# Patient Record
Sex: Male | Born: 1960 | Race: White | Hispanic: No | Marital: Single
Health system: Southern US, Community
[De-identification: ages and names within clinical notes are randomized; demographics above are authoritative.]

---

## 2003-12-24 ENCOUNTER — Emergency Department (HOSPITAL_COMMUNITY): Admission: AC | Admit: 2003-12-24 | Discharge: 2003-12-24 | Payer: Self-pay

## 2006-03-26 ENCOUNTER — Emergency Department (HOSPITAL_COMMUNITY): Admission: EM | Admit: 2006-03-26 | Discharge: 2006-03-26 | Payer: Self-pay | Admitting: Emergency Medicine

## 2010-03-07 ENCOUNTER — Inpatient Hospital Stay (HOSPITAL_COMMUNITY): Admission: EM | Admit: 2010-03-07 | Discharge: 2010-03-18 | Payer: Self-pay | Admitting: Emergency Medicine

## 2010-03-09 ENCOUNTER — Encounter (INDEPENDENT_AMBULATORY_CARE_PROVIDER_SITE_OTHER): Payer: Self-pay | Admitting: Internal Medicine

## 2010-03-09 ENCOUNTER — Ambulatory Visit: Payer: Self-pay | Admitting: Surgery

## 2010-03-09 ENCOUNTER — Ambulatory Visit: Payer: Self-pay | Admitting: Internal Medicine

## 2010-03-10 ENCOUNTER — Encounter: Admission: AD | Admit: 2010-03-10 | Discharge: 2010-03-16 | Payer: Self-pay | Admitting: Internal Medicine

## 2010-03-19 ENCOUNTER — Ambulatory Visit: Payer: Self-pay | Admitting: Psychiatry

## 2010-03-23 ENCOUNTER — Telehealth (INDEPENDENT_AMBULATORY_CARE_PROVIDER_SITE_OTHER): Payer: Self-pay | Admitting: *Deleted

## 2010-06-01 ENCOUNTER — Ambulatory Visit: Payer: Self-pay | Admitting: Surgery

## 2010-06-01 ENCOUNTER — Ambulatory Visit: Admission: RE | Admit: 2010-06-01 | Discharge: 2010-06-01 | Payer: Self-pay | Admitting: Family Medicine

## 2010-12-28 NOTE — Progress Notes (Signed)
Summary: appt/discharge instr given  Phone Note Call from Patient Call back at Home Phone [phone] Call back at [phone]   Caller: Burna Mortimer Call For: wert Reason for Call: Talk to Nurse Summary of Call: wants to know if he should refill his meds he was given , especially his med for blood clots?  I schedulled him for a post hsop/cons with [facility].  Didn't see any discharge papers in computer.  Pt was taking shots for PE, finishe dthem Sat.  Hasn't had anything for PE since Sat.  Please respond asap. Initial call taken by: Eugene Gavia,  March 23, 2010 8:37 AM  Follow-up for Phone Call        Spoke with pt.  He states that he has finished lovenox but has not started the coumadin yet.  Per d/c instructions he is to take coumadin 5 mg daily and be followed by [facility] within the next wk to check PT/INR.  I advised pt of this and he verbalized understanding.  He is going to get rx filled and call today to set up appt with [facility] for early next wk.  He will keep planned hosp f/u here with [facility] for 5/10 and bring all meds to visit. Follow-up by: Vernie Murders,  March 23, 2010 9:08 AM

## 2011-02-13 LAB — VANCOMYCIN, RANDOM: Vancomycin Rm: 11.8 ug/mL

## 2011-02-13 LAB — BLOOD GAS, ARTERIAL
Acid-Base Excess: 9.8 mmol/L — ABNORMAL HIGH (ref 0.0–2.0)
Bicarbonate: 32.4 mEq/L — ABNORMAL HIGH (ref 20.0–24.0)
Bicarbonate: 34.3 mEq/L — ABNORMAL HIGH (ref 20.0–24.0)
Drawn by: 232811
Drawn by: 232811
Drawn by: 308601
FIO2: 0.4 %
MECHVT: 390 mL
O2 Saturation: 95.8 %
O2 Saturation: 97.5 %
O2 Saturation: 97.6 %
PEEP: 5 cmH2O
PEEP: 5 cmH2O
PEEP: 5 cmH2O
PEEP: 5 cmH2O
Patient temperature: 98.6
RATE: 30 resp/min
RATE: 30 resp/min
RATE: 30 resp/min
TCO2: 25.3 mmol/L (ref 0–100)
TCO2: 30.6 mmol/L (ref 0–100)
TCO2: 31.1 mmol/L (ref 0–100)
pCO2 arterial: 42.8 mmHg (ref 35.0–45.0)
pCO2 arterial: 50.1 mmHg — ABNORMAL HIGH (ref 35.0–45.0)
pCO2 arterial: 52.2 mmHg — ABNORMAL HIGH (ref 35.0–45.0)
pH, Arterial: 7.457 — ABNORMAL HIGH (ref 7.350–7.450)
pH, Arterial: 7.492 — ABNORMAL HIGH (ref 7.350–7.450)
pO2, Arterial: 100 mmHg (ref 80.0–100.0)
pO2, Arterial: 76.9 mmHg — ABNORMAL LOW (ref 80.0–100.0)
pO2, Arterial: 83.5 mmHg (ref 80.0–100.0)
pO2, Arterial: 95 mmHg (ref 80.0–100.0)

## 2011-02-13 LAB — GLUCOSE, CAPILLARY
Glucose-Capillary: 102 mg/dL — ABNORMAL HIGH (ref 70–99)
Glucose-Capillary: 102 mg/dL — ABNORMAL HIGH (ref 70–99)
Glucose-Capillary: 103 mg/dL — ABNORMAL HIGH (ref 70–99)
Glucose-Capillary: 103 mg/dL — ABNORMAL HIGH (ref 70–99)
Glucose-Capillary: 103 mg/dL — ABNORMAL HIGH (ref 70–99)
Glucose-Capillary: 105 mg/dL — ABNORMAL HIGH (ref 70–99)
Glucose-Capillary: 106 mg/dL — ABNORMAL HIGH (ref 70–99)
Glucose-Capillary: 108 mg/dL — ABNORMAL HIGH (ref 70–99)
Glucose-Capillary: 111 mg/dL — ABNORMAL HIGH (ref 70–99)
Glucose-Capillary: 112 mg/dL — ABNORMAL HIGH (ref 70–99)
Glucose-Capillary: 114 mg/dL — ABNORMAL HIGH (ref 70–99)
Glucose-Capillary: 115 mg/dL — ABNORMAL HIGH (ref 70–99)
Glucose-Capillary: 118 mg/dL — ABNORMAL HIGH (ref 70–99)
Glucose-Capillary: 120 mg/dL — ABNORMAL HIGH (ref 70–99)
Glucose-Capillary: 121 mg/dL — ABNORMAL HIGH (ref 70–99)
Glucose-Capillary: 123 mg/dL — ABNORMAL HIGH (ref 70–99)
Glucose-Capillary: 128 mg/dL — ABNORMAL HIGH (ref 70–99)
Glucose-Capillary: 130 mg/dL — ABNORMAL HIGH (ref 70–99)
Glucose-Capillary: 141 mg/dL — ABNORMAL HIGH (ref 70–99)

## 2011-02-13 LAB — URINE CULTURE: Culture: NO GROWTH

## 2011-02-13 LAB — HEPATIC FUNCTION PANEL
ALT: 14 U/L (ref 0–53)
AST: 44 U/L — ABNORMAL HIGH (ref 0–37)
Albumin: 2.7 g/dL — ABNORMAL LOW (ref 3.5–5.2)
Indirect Bilirubin: 0.9 mg/dL (ref 0.3–0.9)
Total Bilirubin: 1.3 mg/dL — ABNORMAL HIGH (ref 0.3–1.2)
Total Protein: 6.2 g/dL (ref 6.0–8.3)
Total Protein: 6.3 g/dL (ref 6.0–8.3)

## 2011-02-13 LAB — URINALYSIS, ROUTINE W REFLEX MICROSCOPIC
Bilirubin Urine: NEGATIVE
Glucose, UA: NEGATIVE mg/dL
Hgb urine dipstick: NEGATIVE
Specific Gravity, Urine: 1.014 (ref 1.005–1.030)
Urobilinogen, UA: 0.2 mg/dL (ref 0.0–1.0)
pH: 5 (ref 5.0–8.0)

## 2011-02-13 LAB — BASIC METABOLIC PANEL
BUN: 10 mg/dL (ref 6–23)
BUN: 4 mg/dL — ABNORMAL LOW (ref 6–23)
BUN: 7 mg/dL (ref 6–23)
CO2: 36 mEq/L — ABNORMAL HIGH (ref 19–32)
Calcium: 8.5 mg/dL (ref 8.4–10.5)
Calcium: 8.7 mg/dL (ref 8.4–10.5)
Calcium: 9.3 mg/dL (ref 8.4–10.5)
Chloride: 101 mEq/L (ref 96–112)
Chloride: 95 mEq/L — ABNORMAL LOW (ref 96–112)
Chloride: 95 mEq/L — ABNORMAL LOW (ref 96–112)
Chloride: 97 mEq/L (ref 96–112)
Chloride: 99 mEq/L (ref 96–112)
Creatinine, Ser: 0.66 mg/dL (ref 0.4–1.5)
Creatinine, Ser: 0.67 mg/dL (ref 0.4–1.5)
Creatinine, Ser: 0.87 mg/dL (ref 0.4–1.5)
Creatinine, Ser: 1.24 mg/dL (ref 0.4–1.5)
Creatinine, Ser: 1.46 mg/dL (ref 0.4–1.5)
GFR calc Af Amer: 60 mL/min — ABNORMAL LOW (ref >60)
GFR calc Af Amer: >60 mL/min (ref >60)
GFR calc Af Amer: >60 mL/min (ref >60)
GFR calc Af Amer: >60 mL/min (ref >60)
GFR calc non Af Amer: 51 mL/min — ABNORMAL LOW (ref >60)
GFR calc non Af Amer: >60 mL/min (ref >60)
GFR calc non Af Amer: >60 mL/min (ref >60)
GFR calc non Af Amer: >60 mL/min (ref >60)
GFR calc non Af Amer: >60 mL/min (ref >60)
Glucose, Bld: 106 mg/dL — ABNORMAL HIGH (ref 70–99)
Glucose, Bld: 129 mg/dL — ABNORMAL HIGH (ref 70–99)
Potassium: 3.7 mEq/L (ref 3.5–5.1)
Potassium: 4.4 mEq/L (ref 3.5–5.1)
Sodium: 139 mEq/L (ref 135–145)
Sodium: 141 mEq/L (ref 135–145)
Sodium: 142 mEq/L (ref 135–145)
Sodium: 142 mEq/L (ref 135–145)

## 2011-02-13 LAB — CBC
HCT: 29.7 % — ABNORMAL LOW (ref 39.0–52.0)
HCT: 32.1 % — ABNORMAL LOW (ref 39.0–52.0)
HCT: 33.4 % — ABNORMAL LOW (ref 39.0–52.0)
HCT: 33.4 % — ABNORMAL LOW (ref 39.0–52.0)
Hemoglobin: 10.8 g/dL — ABNORMAL LOW (ref 13.0–17.0)
Hemoglobin: 11.3 g/dL — ABNORMAL LOW (ref 13.0–17.0)
Hemoglobin: 11.6 g/dL — ABNORMAL LOW (ref 13.0–17.0)
Hemoglobin: 12.4 g/dL — ABNORMAL LOW (ref 13.0–17.0)
Hemoglobin: 9.9 g/dL — ABNORMAL LOW (ref 13.0–17.0)
MCHC: 33.6 g/dL (ref 30.0–36.0)
MCHC: 33.7 g/dL (ref 30.0–36.0)
MCV: 115.4 fL — ABNORMAL HIGH (ref 78.0–100.0)
MCV: 115.5 fL — ABNORMAL HIGH (ref 78.0–100.0)
MCV: 117.5 fL — ABNORMAL HIGH (ref 78.0–100.0)
MCV: 117.7 fL — ABNORMAL HIGH (ref 78.0–100.0)
Platelets: 234 10*3/uL (ref 150–400)
Platelets: 299 10*3/uL (ref 150–400)
Platelets: 432 10*3/uL — ABNORMAL HIGH (ref 150–400)
Platelets: 459 10*3/uL — ABNORMAL HIGH (ref 150–400)
Platelets: 467 10*3/uL — ABNORMAL HIGH (ref 150–400)
RBC: 2.87 MIL/uL — ABNORMAL LOW (ref 4.22–5.81)
RBC: 2.89 MIL/uL — ABNORMAL LOW (ref 4.22–5.81)
RBC: 2.96 MIL/uL — ABNORMAL LOW (ref 4.22–5.81)
RBC: 3.02 MIL/uL — ABNORMAL LOW (ref 4.22–5.81)
RDW: 17.4 % — ABNORMAL HIGH (ref 11.5–15.5)
RDW: 17.8 % — ABNORMAL HIGH (ref 11.5–15.5)
RDW: 18.3 % — ABNORMAL HIGH (ref 11.5–15.5)
RDW: 18.6 % — ABNORMAL HIGH (ref 11.5–15.5)
RDW: 18.9 % — ABNORMAL HIGH (ref 11.5–15.5)
WBC: 10.6 10*3/uL — ABNORMAL HIGH (ref 4.0–10.5)
WBC: 11.4 10*3/uL — ABNORMAL HIGH (ref 4.0–10.5)
WBC: 13.8 10*3/uL — ABNORMAL HIGH (ref 4.0–10.5)
WBC: 14.3 10*3/uL — ABNORMAL HIGH (ref 4.0–10.5)
WBC: 14.8 10*3/uL — ABNORMAL HIGH (ref 4.0–10.5)
WBC: 8.9 10*3/uL (ref 4.0–10.5)

## 2011-02-13 LAB — COMPREHENSIVE METABOLIC PANEL
ALT: 40 U/L (ref 0–53)
AST: 79 U/L — ABNORMAL HIGH (ref 0–37)
Albumin: 2.9 g/dL — ABNORMAL LOW (ref 3.5–5.2)
Alkaline Phosphatase: 106 U/L (ref 39–117)
BUN: 14 mg/dL (ref 6–23)
Chloride: 94 mEq/L — ABNORMAL LOW (ref 96–112)
Potassium: 3.7 mEq/L (ref 3.5–5.1)
Total Bilirubin: 1.3 mg/dL — ABNORMAL HIGH (ref 0.3–1.2)

## 2011-02-13 LAB — DIFFERENTIAL
Basophils Relative: 1 % (ref 0–1)
Eosinophils Absolute: 0.1 10*3/uL (ref 0.0–0.7)
Eosinophils Relative: 1 % (ref 0–5)
Lymphocytes Relative: 13 % (ref 12–46)
Monocytes Absolute: 1.1 10*3/uL — ABNORMAL HIGH (ref 0.1–1.0)
Neutrophils Relative %: 73 % (ref 43–77)

## 2011-02-13 LAB — BRAIN NATRIURETIC PEPTIDE: Pro B Natriuretic peptide (BNP): 345 pg/mL — ABNORMAL HIGH (ref 0.0–100.0)

## 2011-02-13 LAB — CARDIAC PANEL(CRET KIN+CKTOT+MB+TROPI)
Relative Index: 1.7 (ref 0.0–2.5)
Relative Index: INVALID (ref 0.0–2.5)
Total CK: 198 U/L (ref 7–232)
Troponin I: 0.14 ng/mL — ABNORMAL HIGH (ref 0.00–0.06)

## 2011-02-13 LAB — PHOSPHORUS: Phosphorus: 4.5 mg/dL (ref 2.3–4.6)

## 2011-02-13 LAB — ALT
ALT: 49 U/L (ref 0–53)
ALT: 78 U/L — ABNORMAL HIGH (ref 0–53)

## 2011-02-13 LAB — MAGNESIUM
Magnesium: 1.6 mg/dL (ref 1.5–2.5)
Magnesium: 1.7 mg/dL (ref 1.5–2.5)

## 2011-02-13 LAB — PROTIME-INR
INR: 1.21 (ref 0.00–1.49)
INR: 1.33 (ref 0.00–1.49)
Prothrombin Time: 14.8 seconds (ref 11.6–15.2)
Prothrombin Time: 16.4 seconds — ABNORMAL HIGH (ref 11.6–15.2)

## 2011-02-13 LAB — C-REACTIVE PROTEIN
CRP: 2.7 mg/dL — ABNORMAL HIGH (ref <0.6)
CRP: 9.1 mg/dL — ABNORMAL HIGH (ref <0.6)

## 2011-02-13 LAB — VANCOMYCIN, TROUGH
Vancomycin Tr: 36.8 ug/mL (ref 10.0–20.0)
Vancomycin Tr: 38.6 ug/mL (ref 10.0–20.0)

## 2011-02-13 LAB — POTASSIUM: Potassium: 3 mEq/L — ABNORMAL LOW (ref 3.5–5.1)

## 2011-02-13 LAB — CK
Total CK: 107 U/L (ref 7–232)
Total CK: 70 U/L (ref 7–232)

## 2011-02-13 LAB — CULTURE, BAL-QUANTITATIVE W GRAM STAIN

## 2011-02-13 LAB — PROCALCITONIN: Procalcitonin: 0.67 ng/mL

## 2011-02-13 LAB — APTT: aPTT: 50 seconds — ABNORMAL HIGH (ref 24–37)

## 2011-02-14 LAB — URINALYSIS, ROUTINE W REFLEX MICROSCOPIC
Hgb urine dipstick: NEGATIVE
Protein, ur: 30 mg/dL — AB
Urobilinogen, UA: 1 mg/dL (ref 0.0–1.0)

## 2011-02-14 LAB — MAGNESIUM
Magnesium: 1.4 mg/dL — ABNORMAL LOW (ref 1.5–2.5)
Magnesium: 1.4 mg/dL — ABNORMAL LOW (ref 1.5–2.5)
Magnesium: 1.7 mg/dL (ref 1.5–2.5)

## 2011-02-14 LAB — HEPATIC FUNCTION PANEL
Bilirubin, Direct: 0.6 mg/dL — ABNORMAL HIGH (ref 0.0–0.3)
Indirect Bilirubin: 1.2 mg/dL — ABNORMAL HIGH (ref 0.3–0.9)

## 2011-02-14 LAB — COMPREHENSIVE METABOLIC PANEL
ALT: 19 U/L (ref 0–53)
AST: 51 U/L — ABNORMAL HIGH (ref 0–37)
Albumin: 3.3 g/dL — ABNORMAL LOW (ref 3.5–5.2)
Alkaline Phosphatase: 106 U/L (ref 39–117)
BUN: 3 mg/dL — ABNORMAL LOW (ref 6–23)
BUN: 4 mg/dL — ABNORMAL LOW (ref 6–23)
BUN: 4 mg/dL — ABNORMAL LOW (ref 6–23)
CO2: 25 mEq/L (ref 19–32)
CO2: 28 mEq/L (ref 19–32)
Chloride: 92 mEq/L — ABNORMAL LOW (ref 96–112)
Chloride: 95 mEq/L — ABNORMAL LOW (ref 96–112)
Chloride: 97 mEq/L (ref 96–112)
Creatinine, Ser: 0.59 mg/dL (ref 0.4–1.5)
Creatinine, Ser: 0.65 mg/dL (ref 0.4–1.5)
GFR calc Af Amer: >60 mL/min (ref >60)
GFR calc non Af Amer: >60 mL/min (ref >60)
GFR calc non Af Amer: >60 mL/min (ref >60)
Glucose, Bld: 99 mg/dL (ref 70–99)
Potassium: 2.1 mEq/L — CL (ref 3.5–5.1)
Sodium: 131 mEq/L — ABNORMAL LOW (ref 135–145)
Total Bilirubin: 1.9 mg/dL — ABNORMAL HIGH (ref 0.3–1.2)
Total Bilirubin: 2 mg/dL — ABNORMAL HIGH (ref 0.3–1.2)
Total Bilirubin: 2.1 mg/dL — ABNORMAL HIGH (ref 0.3–1.2)

## 2011-02-14 LAB — CBC
HCT: 29.3 % — ABNORMAL LOW (ref 39.0–52.0)
HCT: 30.7 % — ABNORMAL LOW (ref 39.0–52.0)
HCT: 34.2 % — ABNORMAL LOW (ref 39.0–52.0)
HCT: 34.5 % — ABNORMAL LOW (ref 39.0–52.0)
Hemoglobin: 10.7 g/dL — ABNORMAL LOW (ref 13.0–17.0)
Hemoglobin: 11.8 g/dL — ABNORMAL LOW (ref 13.0–17.0)
Hemoglobin: 12.1 g/dL — ABNORMAL LOW (ref 13.0–17.0)
MCHC: 33.9 g/dL (ref 30.0–36.0)
MCV: 116.5 fL — ABNORMAL HIGH (ref 78.0–100.0)
MCV: 117.5 fL — ABNORMAL HIGH (ref 78.0–100.0)
Platelets: 248 10*3/uL (ref 150–400)
RBC: 2.66 MIL/uL — ABNORMAL LOW (ref 4.22–5.81)
RBC: 2.96 MIL/uL — ABNORMAL LOW (ref 4.22–5.81)
RDW: 17.8 % — ABNORMAL HIGH (ref 11.5–15.5)
WBC: 11.2 10*3/uL — ABNORMAL HIGH (ref 4.0–10.5)
WBC: 12.7 10*3/uL — ABNORMAL HIGH (ref 4.0–10.5)
WBC: 12.7 10*3/uL — ABNORMAL HIGH (ref 4.0–10.5)
WBC: 8.6 10*3/uL (ref 4.0–10.5)

## 2011-02-14 LAB — DIFFERENTIAL
Basophils Absolute: 0.1 10*3/uL (ref 0.0–0.1)
Basophils Relative: 1 % (ref 0–1)
Eosinophils Absolute: 0 10*3/uL (ref 0.0–0.7)
Eosinophils Absolute: 0 10*3/uL (ref 0.0–0.7)
Eosinophils Relative: 0 % (ref 0–5)
Eosinophils Relative: 0 % (ref 0–5)
Lymphocytes Relative: 14 % (ref 12–46)
Lymphs Abs: 0.5 10*3/uL — ABNORMAL LOW (ref 0.7–4.0)
Lymphs Abs: 1.7 10*3/uL (ref 0.7–4.0)
Lymphs Abs: 1.9 10*3/uL (ref 0.7–4.0)
Monocytes Absolute: 0.9 10*3/uL (ref 0.1–1.0)
Monocytes Absolute: 1.2 10*3/uL — ABNORMAL HIGH (ref 0.1–1.0)
Monocytes Relative: 11 % (ref 3–12)
Monocytes Relative: 13 % — ABNORMAL HIGH (ref 3–12)
Neutrophils Relative %: 71 % (ref 43–77)

## 2011-02-14 LAB — BLOOD GAS, ARTERIAL
Bicarbonate: 25.5 mEq/L — ABNORMAL HIGH (ref 20.0–24.0)
Bicarbonate: 26.2 mEq/L — ABNORMAL HIGH (ref 20.0–24.0)
Drawn by: 310571
FIO2: 1 %
O2 Saturation: 99.3 %
PEEP: 5 cmH2O
Patient temperature: 98.6
RATE: 20 resp/min
pCO2 arterial: 49.1 mmHg — ABNORMAL HIGH (ref 35.0–45.0)
pH, Arterial: 7.335 — ABNORMAL LOW (ref 7.350–7.450)
pO2, Arterial: 136 mmHg — ABNORMAL HIGH (ref 80.0–100.0)

## 2011-02-14 LAB — PROTIME-INR
INR: 1.27 (ref 0.00–1.49)
Prothrombin Time: 15.8 seconds — ABNORMAL HIGH (ref 11.6–15.2)

## 2011-02-14 LAB — CARBOXYHEMOGLOBIN
Carboxyhemoglobin: 1.5 % (ref 0.5–1.5)
Methemoglobin: 3.3 % — ABNORMAL HIGH (ref 0.0–1.5)
Total hemoglobin: 9.7 g/dL — ABNORMAL LOW (ref 13.5–18.0)

## 2011-02-14 LAB — AFP TUMOR MARKER: AFP-Tumor Marker: 2.5 ng/mL (ref 0.0–8.0)

## 2011-02-14 LAB — BASIC METABOLIC PANEL
BUN: 6 mg/dL (ref 6–23)
Calcium: 7.7 mg/dL — ABNORMAL LOW (ref 8.4–10.5)
Chloride: 100 mEq/L (ref 96–112)
Creatinine, Ser: 0.63 mg/dL (ref 0.4–1.5)
GFR calc Af Amer: >60 mL/min (ref >60)
GFR calc non Af Amer: >60 mL/min (ref >60)

## 2011-02-14 LAB — AMMONIA: Ammonia: 35 umol/L (ref 11–35)

## 2011-02-14 LAB — GLUCOSE, CAPILLARY
Glucose-Capillary: 117 mg/dL — ABNORMAL HIGH (ref 70–99)
Glucose-Capillary: 134 mg/dL — ABNORMAL HIGH (ref 70–99)

## 2011-02-14 LAB — GAMMA GT: GGT: 404 U/L — ABNORMAL HIGH (ref 7–51)

## 2011-02-14 LAB — CARDIAC PANEL(CRET KIN+CKTOT+MB+TROPI): Troponin I: 0.16 ng/mL — ABNORMAL HIGH (ref 0.00–0.06)

## 2011-02-14 LAB — TYPE AND SCREEN

## 2011-02-14 LAB — T4, FREE: Free T4: 1.49 ng/dL (ref 0.80–1.80)

## 2011-02-14 LAB — TSH: TSH: 7.739 u[IU]/mL — ABNORMAL HIGH (ref 0.350–4.500)

## 2011-02-14 LAB — CORTISOL: Cortisol, Plasma: 26 ug/dL

## 2011-02-14 LAB — BRAIN NATRIURETIC PEPTIDE: Pro B Natriuretic peptide (BNP): 415 pg/mL — ABNORMAL HIGH (ref 0.0–100.0)

## 2011-02-14 LAB — URINE MICROSCOPIC-ADD ON

## 2011-02-14 LAB — ETHANOL: Alcohol, Ethyl (B): 97 mg/dL — ABNORMAL HIGH (ref 0–10)

## 2011-02-14 LAB — PHOSPHORUS: Phosphorus: 4.4 mg/dL (ref 2.3–4.6)

## 2011-02-14 LAB — CULTURE, BLOOD (ROUTINE X 2)

## 2011-03-05 IMAGING — CR DG CHEST 1V PORT
1 series · 1 of 1 positions shown · non-contrast
Comparison: 03/10/2010

CLINICAL DATA: Chronic liver disease, ARDS, ventilatory support

PORTABLE CHEST - 1 VIEW

[view not recorded]
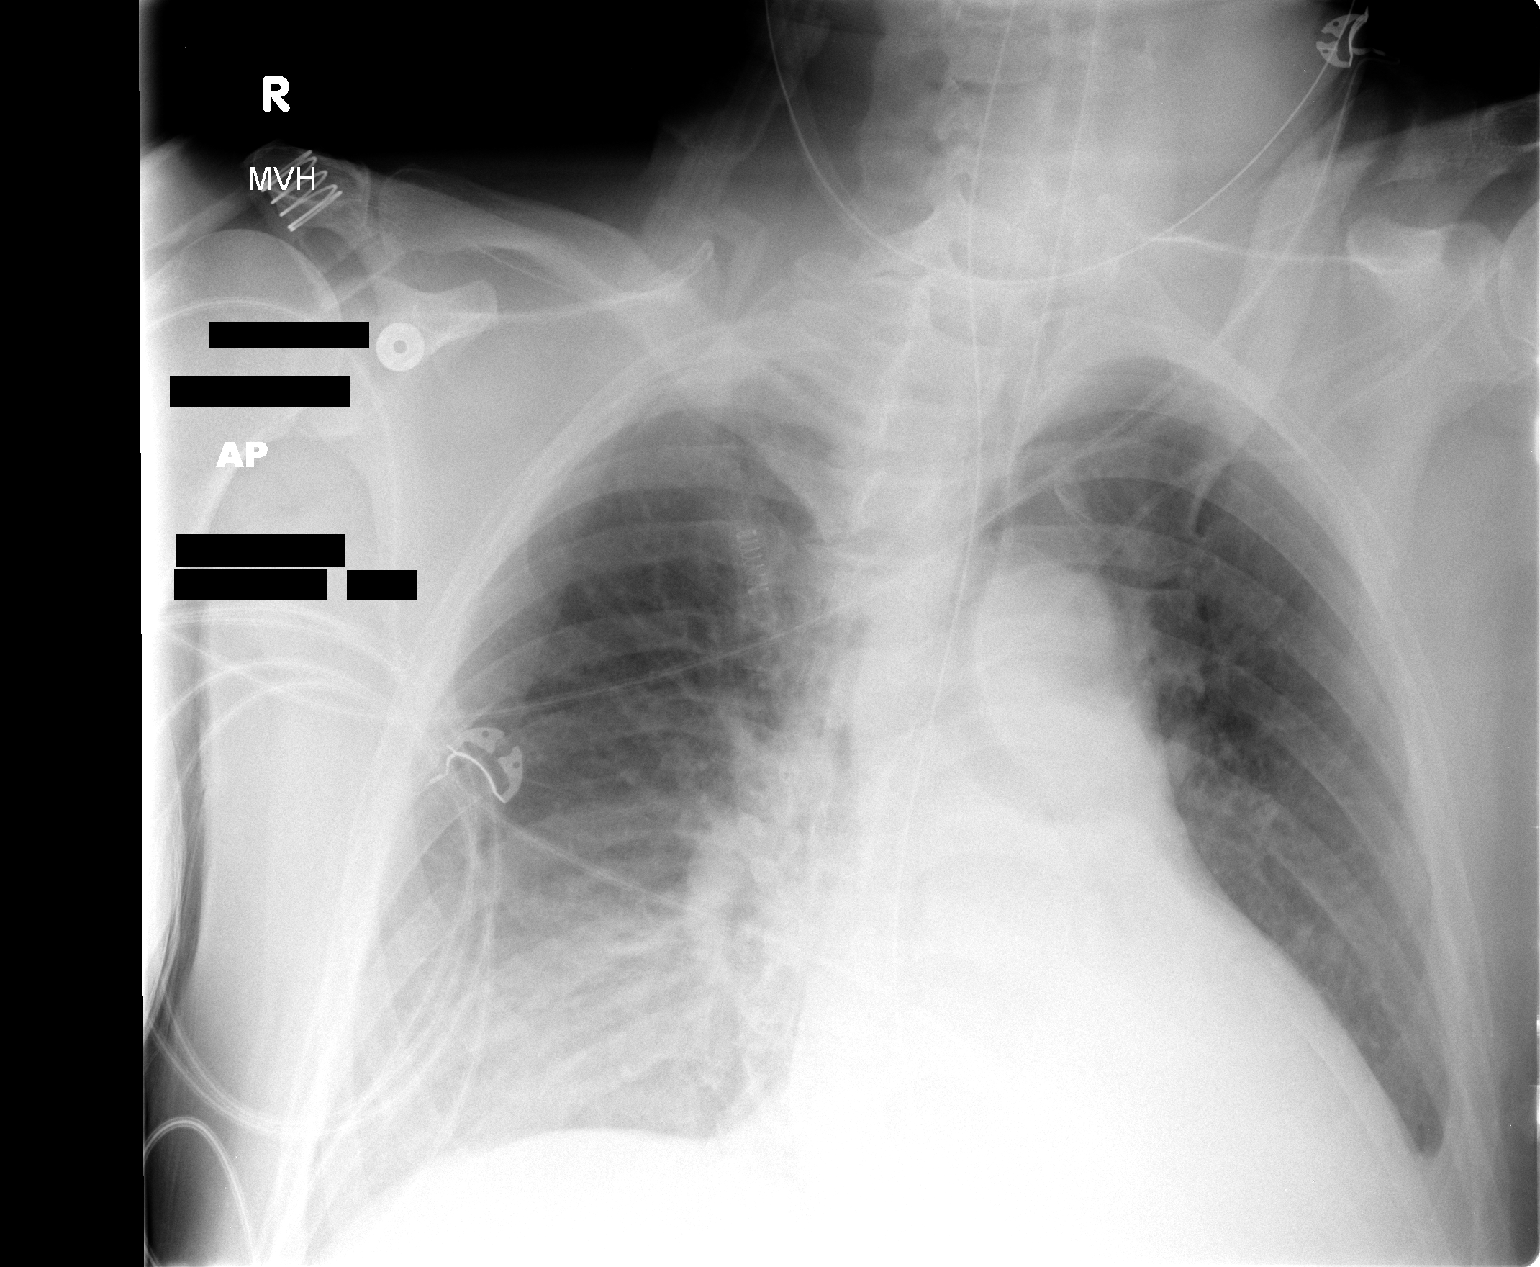

[1 of 1 positions shown; findings below may reference images not displayed]

FINDINGS: Slight rotation to the left.  Stable support apparatus.
Dense left lower lobe consolidation/atelectasis persist with
associated effusion.  Stable right lung aeration.  No pneumothorax.
No enlarging effusion.
IMPRESSION: Stable portable chest exam

## 2011-03-06 IMAGING — CR DG ABD PORTABLE 1V
1 series · 1 of 1 positions shown · non-contrast
Comparison: None.

CLINICAL DATA: Assess tube placement.

ABDOMEN - 1 VIEW

[view not recorded]
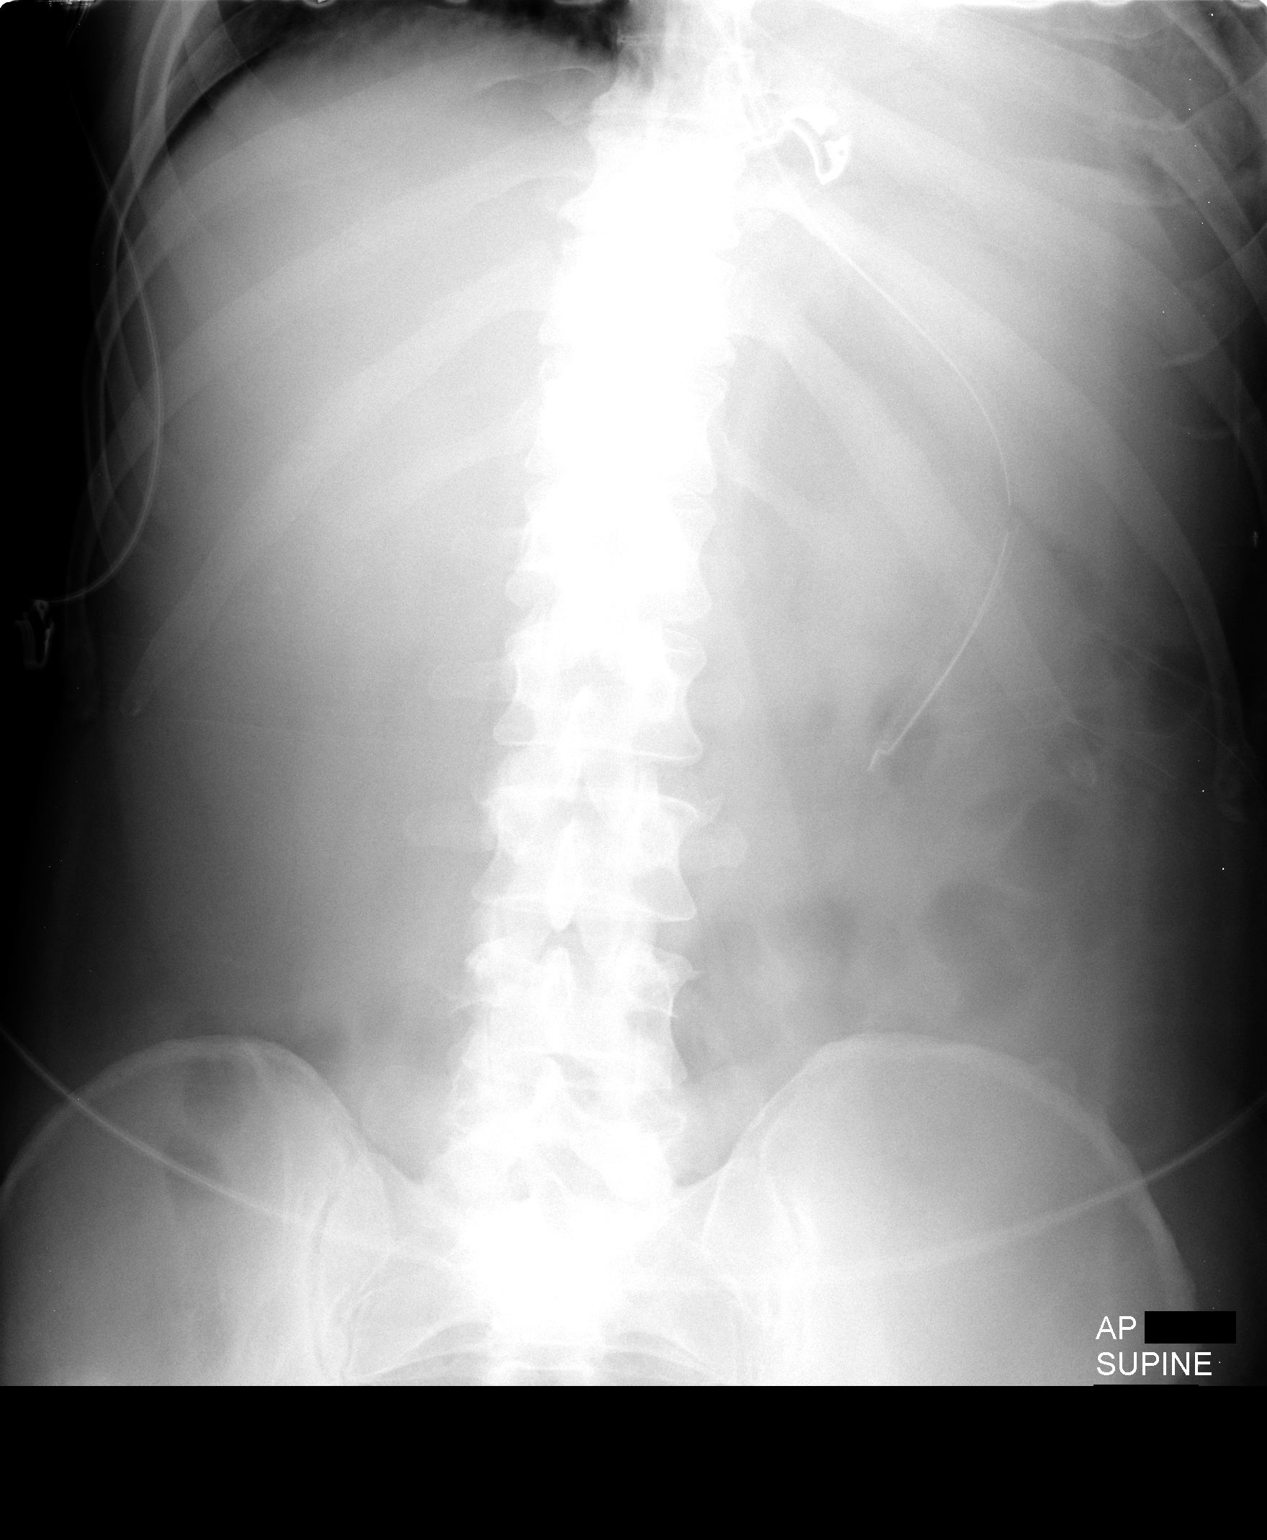

[1 of 1 positions shown; findings below may reference images not displayed]

FINDINGS: NG tube is in the mid to distal stomach.  Bowel gas
pattern is unremarkable.
IMPRESSION: Specifically, the NG tube is in the mid to distal stomach.

## 2011-03-06 IMAGING — CR DG CHEST 1V PORT
1 series · 1 of 1 positions shown · non-contrast
Comparison: Portable chest 6151 hours

CLINICAL DATA: Pedal edema chronic liver disease.  Tube placement.

PORTABLE CHEST - 1 VIEW

[view not recorded]
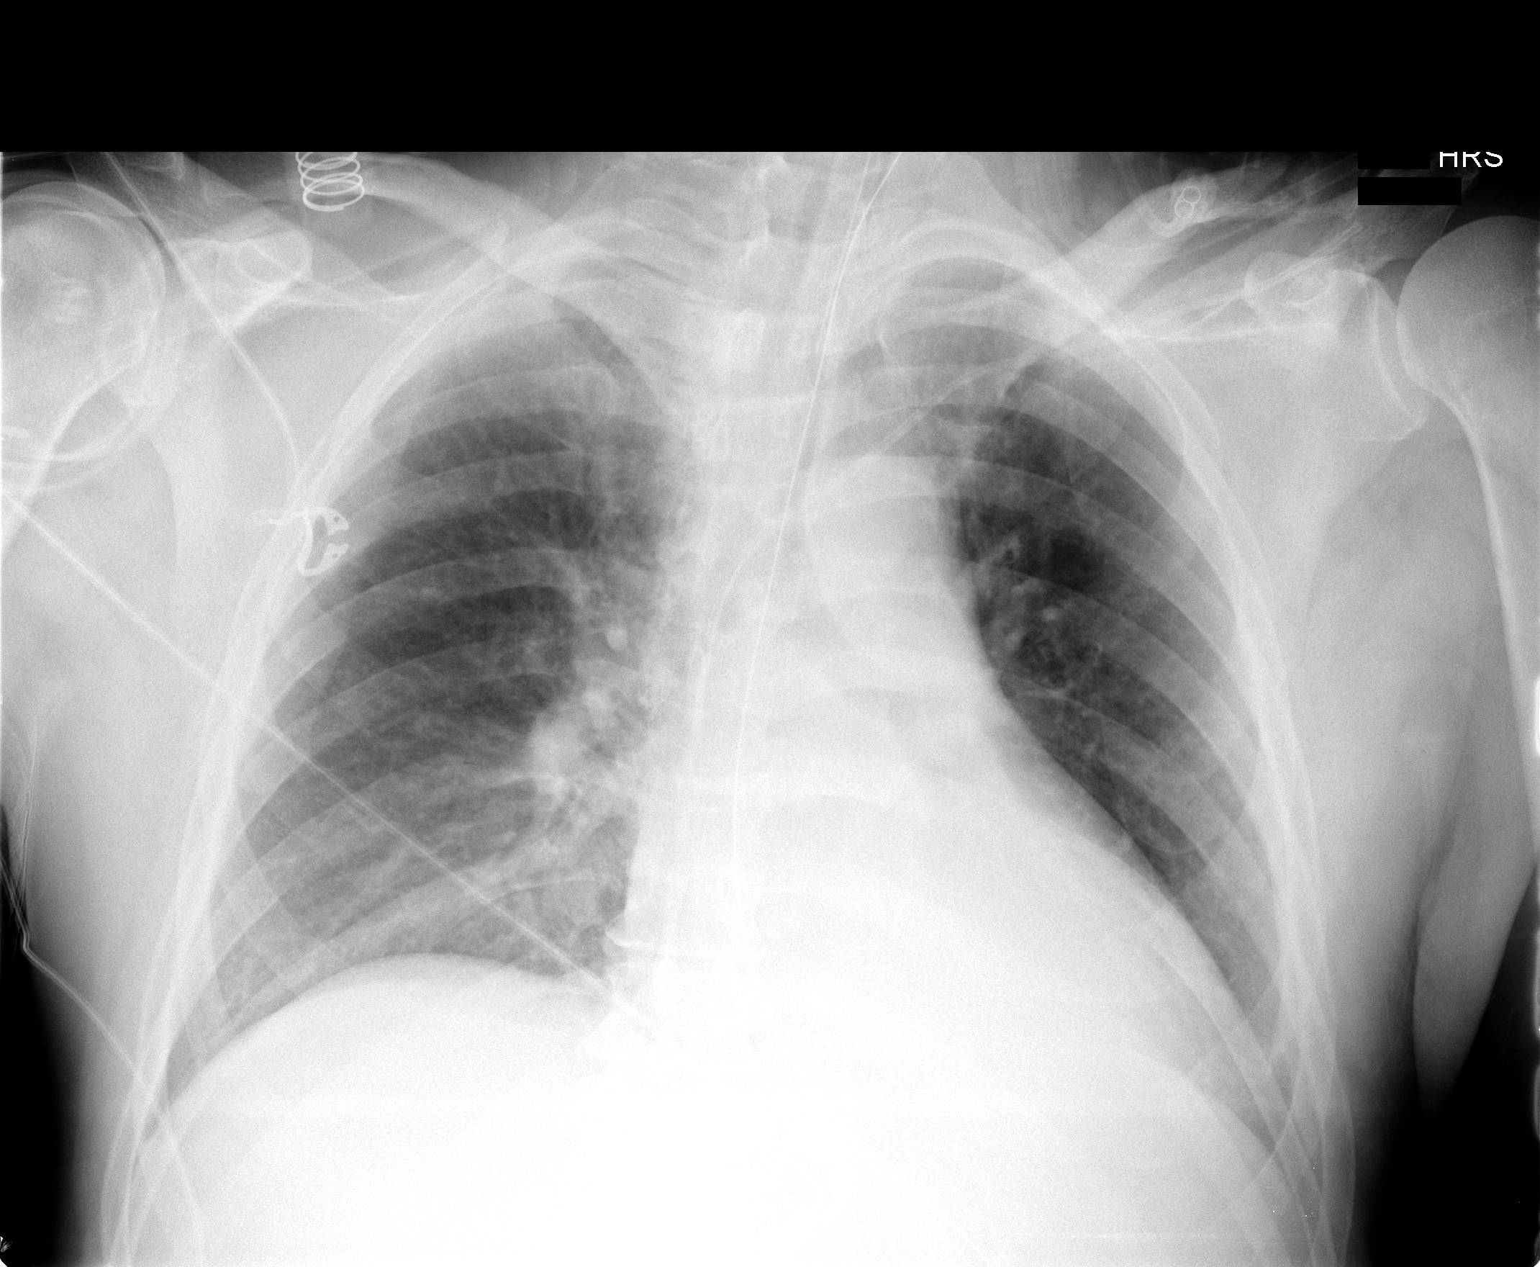

[1 of 1 positions shown; findings below may reference images not displayed]

FINDINGS: ETT is been inserted and is in satisfactory position.  NG
tube is traversing esophagus and process stomach.  Central venous
catheter is in the mid to lower SVC.  Left lower lobe atelectasis /
consolidation is again noted.
IMPRESSION: Satisfactory ET tube position.  Left lower lobe atelectasis /
consolidation.

## 2011-03-08 IMAGING — CR DG CHEST 1V PORT
1 series · 1 of 1 positions shown · non-contrast
Comparison: Multiple recent previous exams.

CLINICAL DATA: Respiratory failure

PORTABLE CHEST - 1 VIEW

[view not recorded]
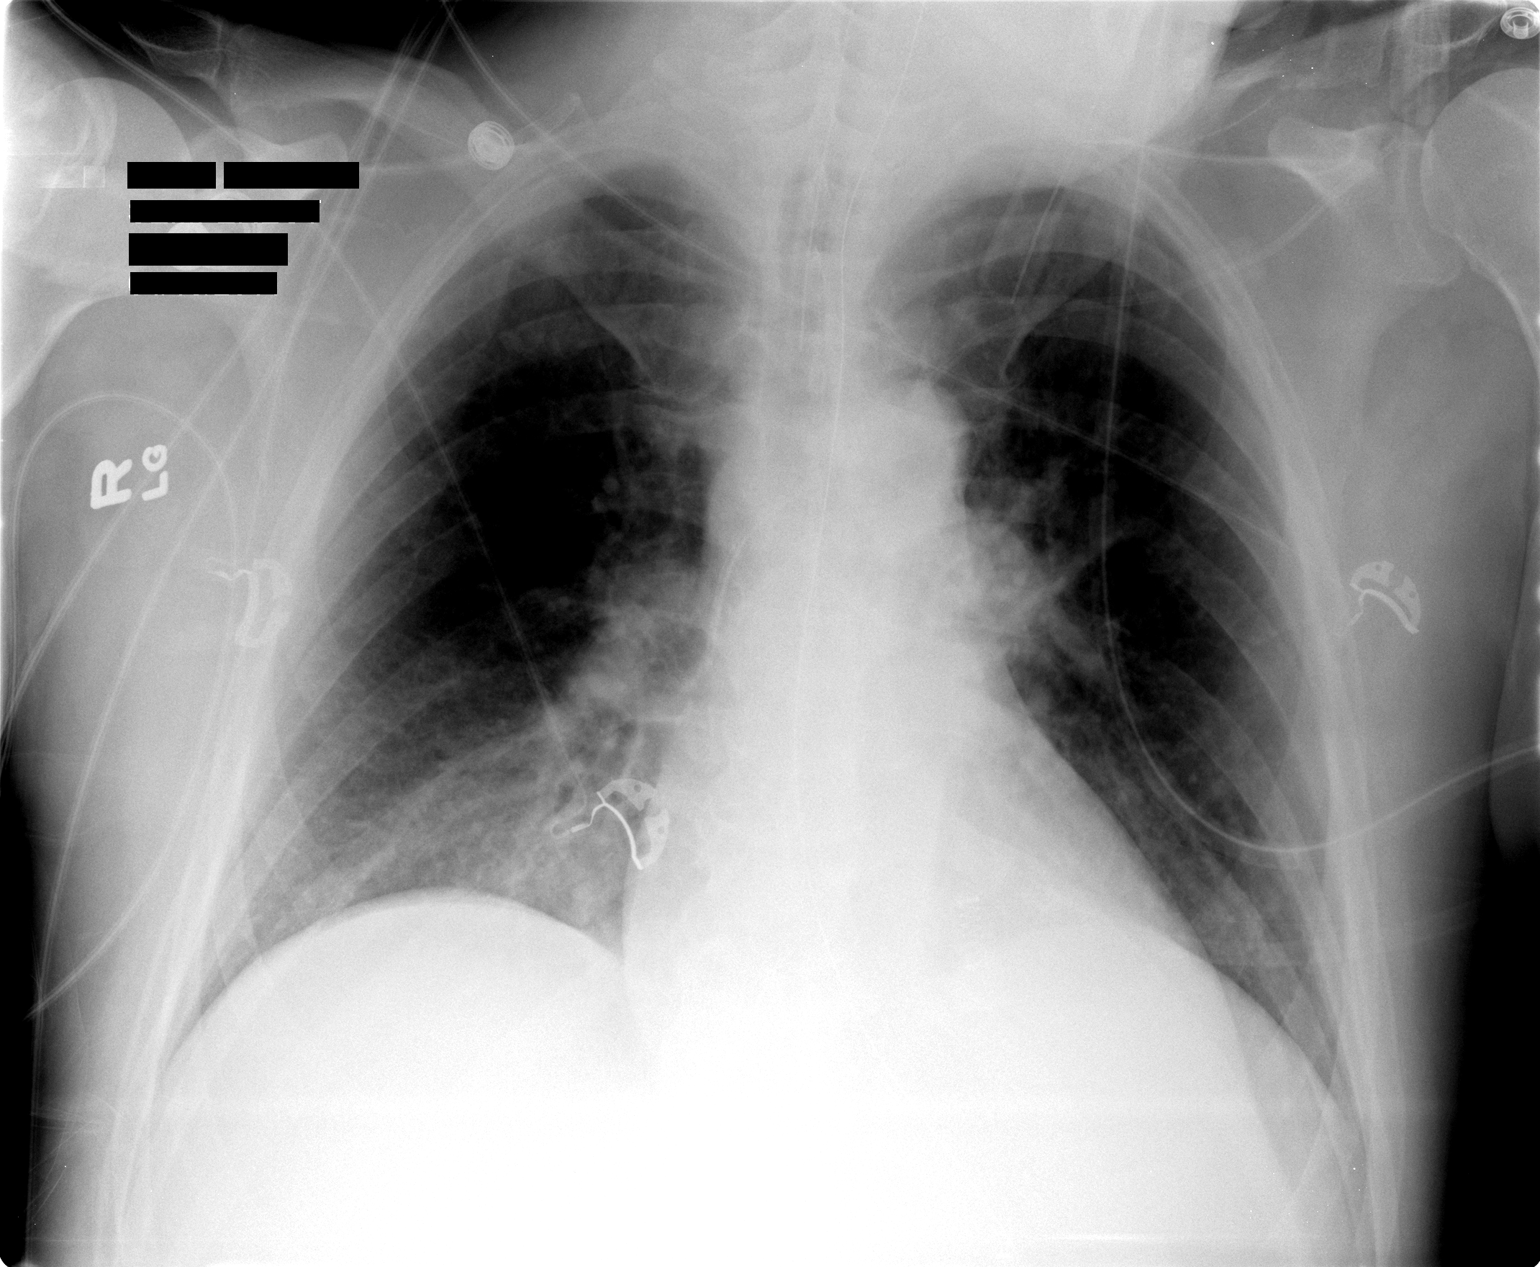

[1 of 1 positions shown; findings below may reference images not displayed]

FINDINGS: 8418 hours.  Endotracheal tube tip is 5.3 cm above the
base of the carina.  Left subclavian central line tip projects at
the mid SVC level. The NG tube passes into the stomach although the
distal tip position is not included on the film.

Lung volumes remain low.  There is persistent bibasilar collapse /
consolidation, right greater than left. Telemetry leads overlie the
chest.
IMPRESSION: Slight improvement in aeration at the left lung base.  Otherwise
stable exam.

## 2011-03-09 IMAGING — CR DG CHEST 1V PORT
1 series · 1 of 1 positions shown · non-contrast
Comparison: 03/14/2010

CLINICAL DATA: Respiratory distress, ventilator.

PORTABLE CHEST - 1 VIEW

[view not recorded]
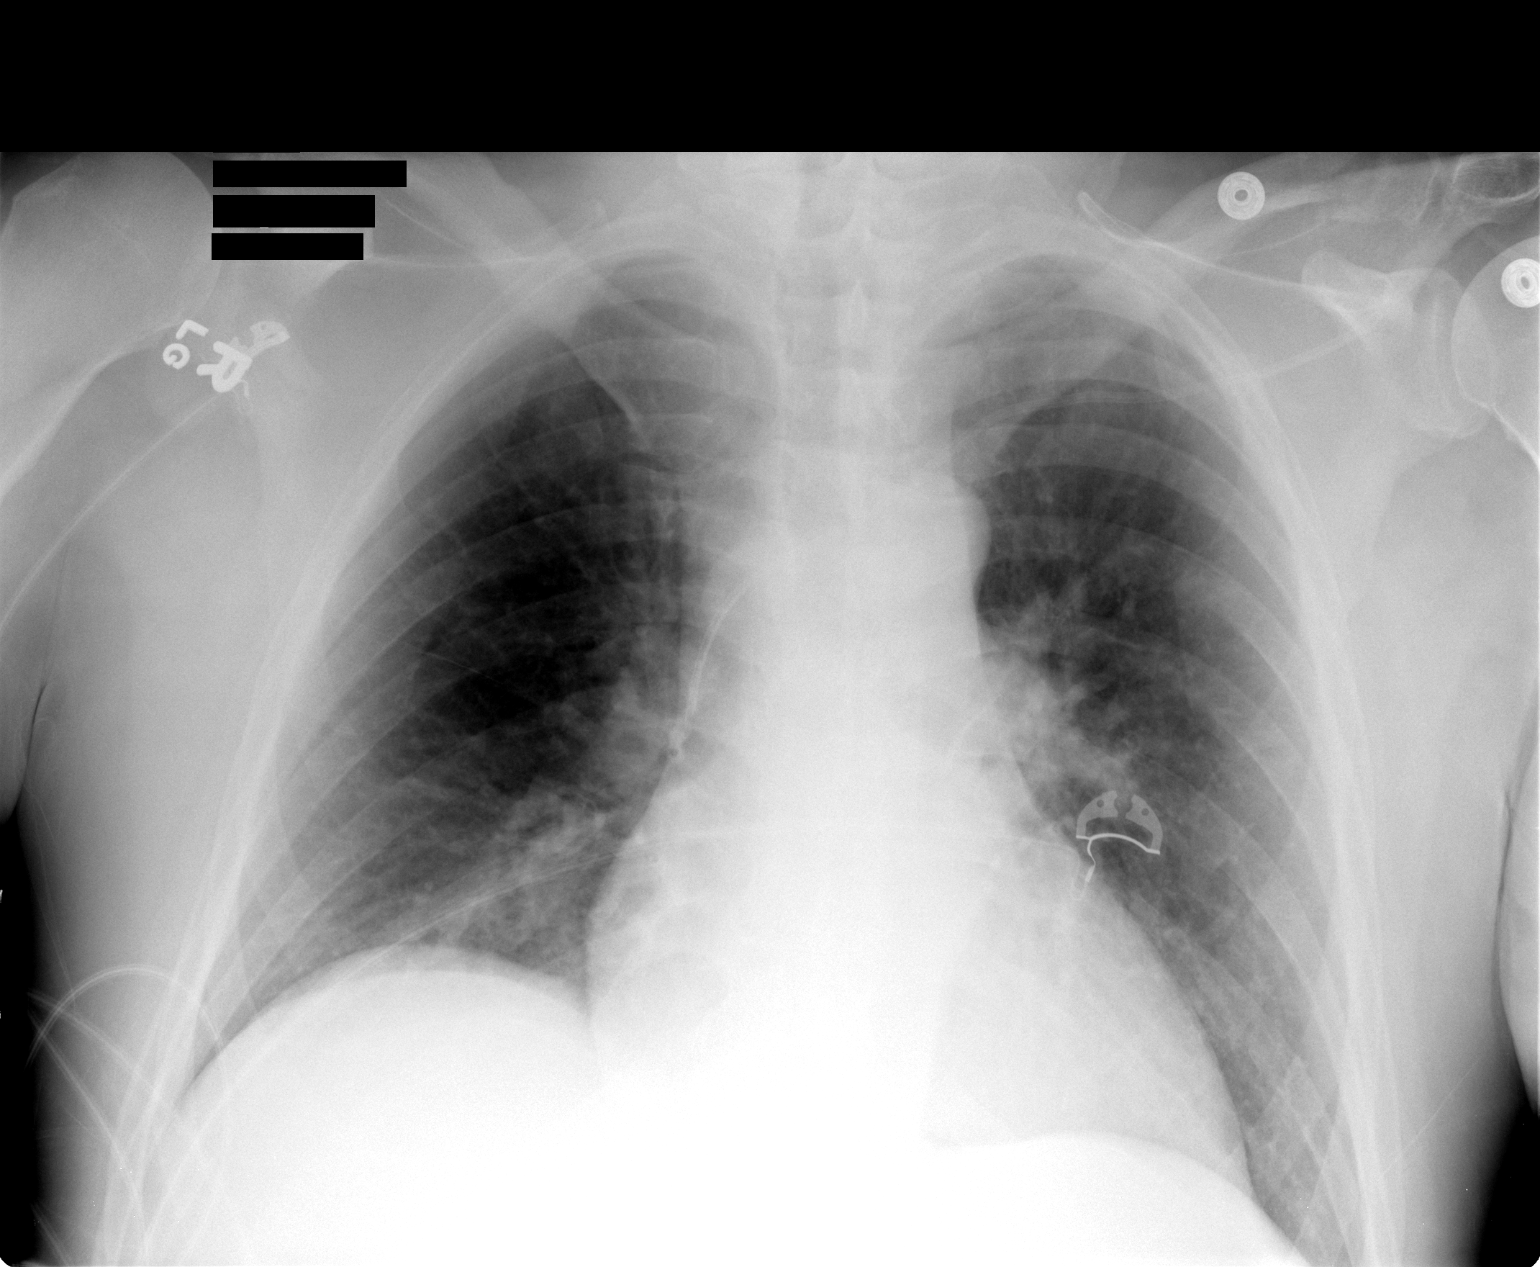

[1 of 1 positions shown; findings below may reference images not displayed]

FINDINGS: Trachea is midline.  Heart size normal.  Left subclavian
central line tip projects over the SVC.  There is bibasilar
atelectasis, with improved aeration in the right lower lobe.  Left
costophrenic angle was not occluded film.  Old distal left clavicle
fracture.
IMPRESSION: Bibasilar atelectasis, with improved right lower lobe aeration.

## 2021-07-30 NOTE — Progress Notes (Signed)
Erroneous encounter

## 2021-08-01 ENCOUNTER — Encounter: Payer: Self-pay | Admitting: Family

## 2021-08-01 DIAGNOSIS — Z7689 Persons encountering health services in other specified circumstances: Secondary | ICD-10-CM
